# Patient Record
Sex: Female | Born: 1945 | Race: Black or African American | Hispanic: No | Smoking: Never smoker
Health system: Southern US, Community
[De-identification: ages and names within clinical notes are randomized; demographics above are authoritative.]

## PROBLEM LIST (undated history)

## (undated) DIAGNOSIS — B351 Tinea unguium: Secondary | ICD-10-CM

## (undated) DIAGNOSIS — I10 Essential (primary) hypertension: Secondary | ICD-10-CM

## (undated) DIAGNOSIS — E669 Obesity, unspecified: Secondary | ICD-10-CM

## (undated) DIAGNOSIS — E119 Type 2 diabetes mellitus without complications: Secondary | ICD-10-CM

## (undated) DIAGNOSIS — C50919 Malignant neoplasm of unspecified site of unspecified female breast: Secondary | ICD-10-CM

## (undated) DIAGNOSIS — E7800 Pure hypercholesterolemia, unspecified: Secondary | ICD-10-CM

## (undated) DIAGNOSIS — M2550 Pain in unspecified joint: Secondary | ICD-10-CM

## (undated) DIAGNOSIS — M5430 Sciatica, unspecified side: Secondary | ICD-10-CM

## (undated) HISTORY — PX: KIDNEY CYST REMOVAL: SHX684

## (undated) HISTORY — PX: BREAST LUMPECTOMY: SHX2

## (undated) HISTORY — DX: Sciatica, unspecified side: M54.30

## (undated) HISTORY — PX: LYMPH NODE DISSECTION: SHX5087

## (undated) HISTORY — DX: Pain in unspecified joint: M25.50

## (undated) HISTORY — DX: Obesity, unspecified: E66.9

## (undated) HISTORY — DX: Essential (primary) hypertension: I10

## (undated) HISTORY — DX: Tinea unguium: B35.1

## (undated) HISTORY — DX: Type 2 diabetes mellitus without complications: E11.9

## (undated) HISTORY — DX: Pure hypercholesterolemia, unspecified: E78.00

## (undated) HISTORY — PX: HERNIA REPAIR: SHX51

---

## 2011-12-02 ENCOUNTER — Encounter: Payer: Self-pay | Admitting: Cardiology

## 2011-12-02 ENCOUNTER — Encounter: Payer: Self-pay | Admitting: *Deleted

## 2011-12-02 ENCOUNTER — Telehealth: Payer: Self-pay | Admitting: Cardiology

## 2011-12-02 NOTE — Telephone Encounter (Signed)
Follow-up: ° ° ° °Patient called returning your call. Please call back. °

## 2011-12-02 NOTE — Telephone Encounter (Signed)
Spoke with pt, appt tomorrow moved from 8:45a to 11:30AM in [place]

## 2011-12-03 ENCOUNTER — Encounter: Payer: Self-pay | Admitting: Cardiology

## 2011-12-03 ENCOUNTER — Institutional Professional Consult (permissible substitution): Payer: Self-pay | Admitting: Cardiology

## 2011-12-03 ENCOUNTER — Ambulatory Visit (INDEPENDENT_AMBULATORY_CARE_PROVIDER_SITE_OTHER): Payer: Self-pay | Admitting: Cardiology

## 2011-12-03 VITALS — BP 130/80 | HR 97 | Ht 62.0 in | Wt 181.0 lb

## 2011-12-03 DIAGNOSIS — E785 Hyperlipidemia, unspecified: Secondary | ICD-10-CM | POA: Insufficient documentation

## 2011-12-03 DIAGNOSIS — I10 Essential (primary) hypertension: Secondary | ICD-10-CM | POA: Insufficient documentation

## 2011-12-03 DIAGNOSIS — E119 Type 2 diabetes mellitus without complications: Secondary | ICD-10-CM

## 2011-12-03 DIAGNOSIS — R002 Palpitations: Secondary | ICD-10-CM | POA: Insufficient documentation

## 2011-12-03 MED ORDER — METOPROLOL SUCCINATE ER 25 MG PO TB24: 25.0000 mg | ORAL_TABLET | Freq: Every day | ORAL | Status: DC

## 2011-12-03 NOTE — Assessment & Plan Note (Addendum)
Patient has mild palpitations and a sensation that her heart rate is elevated. She is mildly tachycardic at times. I will discontinue her Norvasc and instead treat her blood pressure with Toprol 25 mg daily. Hopefully this will improve her symptoms. Note she had recent laboratories that showed a potassium of 4.1 and normal renal function. Hemoglobin in may of 2013 12.9. TSH 1.773. Check echocardiogram for LV function.

## 2011-12-03 NOTE — Progress Notes (Signed)
HPI: 65 year old female with no prior cardiac history for evaluation of palpitations and tachycardia. Patient checks her pulse routinely and it runs between 94 and 120. She occasionally feels an elevation in her heart rate but no sustained palpitations. She has mild dyspnea on exertion but no orthopnea, PND, pedal edema, syncope or exertional chest pain. Because of her palpitations we were asked to evaluate.  Current Outpatient Prescriptions  Medication Sig Dispense Refill  . amLODipine (NORVASC) 5 MG tablet Take 5 mg by mouth daily.      Marland Kitchen aspirin 81 MG tablet Take 81 mg by mouth daily.      . calcium carbonate (OS-CAL) 600 MG TABS Take 600 mg by mouth daily.      . cholecalciferol (VITAMIN D) 1000 UNITS tablet Take 1,000 Units by mouth daily.      Marland Kitchen co-enzyme Q-10 30 MG capsule Take 30 mg by mouth daily.      Marland Kitchen ezetimibe (ZETIA) 10 MG tablet Take 10 mg by mouth daily.      Marland Kitchen gabapentin (NEURONTIN) 300 MG capsule Take 300 mg by mouth 2 (two) times daily.      Marland Kitchen HYDROcodone-acetaminophen (NORCO/VICODIN) 5-325 MG per tablet Take 1 tablet by mouth every 6 (six) hours as needed.      Marland Kitchen lisinopril (PRINIVIL,ZESTRIL) 2.5 MG tablet Take 2.5 mg by mouth daily.      . methocarbamol (ROBAXIN) 750 MG tablet Take 750 mg by mouth 3 (three) times daily.      . Omega-3 Fatty Acids (FISH OIL PO) Take 1 tablet by mouth daily.        No Known Allergies  Past Medical History  Diagnosis Date  . Diabetes mellitus   . Hypercholesteremia   . HTN (hypertension)   . Sciatica   . Joint pain   . Obesity   . Onychomycosis     Past Surgical History  Procedure Date  . Hernia repair     History   Social History  . Marital Status: Legally Separated    Spouse Name: N/A    Number of Children: 2  . Years of Education: N/A   Occupational History  .      Retired   Social History Main Topics  . Smoking status: Never Smoker   . Smokeless tobacco: Not on file  . Alcohol Use: No  . Drug Use: No  .  Sexually Active: Not on file   Other Topics Concern  . Not on file   Social History Narrative  . No narrative on file    Family History  Problem Relation Age of Onset  . Diabetes    . Heart disease Sister     Tachycardia  . Hypertension    . Coronary artery disease Mother     MI at age 63    ROS: problems with back pain but no fevers or chills, productive cough, hemoptysis, dysphasia, odynophagia, melena, hematochezia, dysuria, hematuria, rash, seizure activity, orthopnea, PND, pedal edema, claudication. Remaining systems are negative.  Physical Exam:   Blood pressure 130/80, pulse 97, height 5\' 2"  (1.575 m), weight 181 lb (82.101 kg).  General:  Well developed/well nourished in NAD Skin warm/dry Patient not depressed No peripheral clubbing Back-normal HEENT-normal/normal eyelids Neck supple/normal carotid upstroke bilaterally; no bruits; no JVD; no thyromegaly chest - CTA/ normal expansion CV - RRR/normal S1 and S2; no murmurs, rubs or gallops;  PMI nondisplaced Abdomen -NT/ND, no HSM, no mass, + bowel sounds, no bruit 2+ femoral pulses, no  bruits Ext-no edema, chords, 2+ DP Neuro-grossly nonfocal  ECG sinus rhythm at a rate of 97. Nonspecific ST changes.

## 2011-12-03 NOTE — Patient Instructions (Addendum)
Your physician recommends that you schedule a follow-up appointment in: 8 WEEKS WITH DR CRENSHAW IN [place]  Your physician has requested that you have an echocardiogram. Echocardiography is a painless test that uses sound waves to create images of your heart. It provides your doctor with information about the size and shape of your heart and how well your heart's chambers and valves are working. This procedure takes approximately one hour. There are no restrictions for this procedure.   STOP AMLODIPINE  START METOPROLOL ER 25 MG ONCE DAILY WITH FOOD

## 2011-12-03 NOTE — Assessment & Plan Note (Signed)
Blood pressure controlled. Discontinue Norvasc and begin Toprol 25 mg daily for blood pressure and palpitations.

## 2011-12-03 NOTE — Assessment & Plan Note (Signed)
Management per primary care. 

## 2011-12-05 ENCOUNTER — Ambulatory Visit (HOSPITAL_COMMUNITY): Payer: Self-pay | Attending: Cardiology

## 2011-12-05 DIAGNOSIS — I379 Nonrheumatic pulmonary valve disorder, unspecified: Secondary | ICD-10-CM | POA: Insufficient documentation

## 2011-12-05 DIAGNOSIS — I369 Nonrheumatic tricuspid valve disorder, unspecified: Secondary | ICD-10-CM | POA: Insufficient documentation

## 2011-12-05 DIAGNOSIS — I059 Rheumatic mitral valve disease, unspecified: Secondary | ICD-10-CM | POA: Insufficient documentation

## 2011-12-05 DIAGNOSIS — I10 Essential (primary) hypertension: Secondary | ICD-10-CM | POA: Insufficient documentation

## 2011-12-05 DIAGNOSIS — R002 Palpitations: Secondary | ICD-10-CM | POA: Insufficient documentation

## 2011-12-05 DIAGNOSIS — E119 Type 2 diabetes mellitus without complications: Secondary | ICD-10-CM | POA: Insufficient documentation

## 2011-12-05 NOTE — Progress Notes (Signed)
Echocardiogram performed.  

## 2012-01-21 ENCOUNTER — Telehealth: Payer: Self-pay | Admitting: Cardiology

## 2012-01-21 ENCOUNTER — Encounter: Payer: Self-pay | Admitting: Cardiology

## 2012-01-21 ENCOUNTER — Ambulatory Visit (INDEPENDENT_AMBULATORY_CARE_PROVIDER_SITE_OTHER): Payer: Self-pay | Admitting: Cardiology

## 2012-01-21 VITALS — BP 120/78 | HR 94 | Ht 63.0 in | Wt 178.0 lb

## 2012-01-21 DIAGNOSIS — R002 Palpitations: Secondary | ICD-10-CM

## 2012-01-21 MED ORDER — METOPROLOL SUCCINATE ER 50 MG PO TB24: 50.0000 mg | ORAL_TABLET | Freq: Every day | ORAL | Status: AC

## 2012-01-21 NOTE — Assessment & Plan Note (Signed)
Blood pressure is controlled. Given that we are increasing Toprol I will discontinue Norvasc. Followup with her primary care for blood pressure.

## 2012-01-21 NOTE — Progress Notes (Signed)
   HPI: 66 year old female with no prior cardiac history I intially saw in Sept 2013 for evaluation of palpitations and tachycardia. It was noted at that time that recent laboratories included a potassium of 4.1, and that hemoglobin in May of 2013 was 12.9 and TSH 1.773. Echocardiogram in September of 2013 showed normal LV function and grade 1 diastolic dysfunction. When I saw her previously we discontinued her Norvasc and add Toprol for her blood pressure and palpitations. Since then, her heart rate is mildly elevated. There is no dyspnea or chest pain and no syncope.   Current Outpatient Prescriptions  Medication Sig Dispense Refill  . amLODipine (NORVASC) 5 MG tablet Take 5 mg by mouth daily.      Marland Kitchen aspirin 81 MG tablet Take 81 mg by mouth daily.      . calcium carbonate (OS-CAL) 600 MG TABS Take 600 mg by mouth daily.      . cholecalciferol (VITAMIN D) 1000 UNITS tablet Take 1,000 Units by mouth daily.      Marland Kitchen co-enzyme Q-10 30 MG capsule Take 30 mg by mouth daily.      . colesevelam (WELCHOL) 625 MG tablet 3 tabs po bid      . ezetimibe (ZETIA) 10 MG tablet Take 10 mg by mouth daily.      Marland Kitchen gabapentin (NEURONTIN) 300 MG capsule Take 300 mg by mouth 2 (two) times daily.      Marland Kitchen HYDROcodone-acetaminophen (NORCO/VICODIN) 5-325 MG per tablet Take 1 tablet by mouth every 6 (six) hours as needed.      Marland Kitchen lisinopril (PRINIVIL,ZESTRIL) 2.5 MG tablet Take 2.5 mg by mouth daily.      . methocarbamol (ROBAXIN) 750 MG tablet Take 750 mg by mouth 3 (three) times daily.      . metoprolol succinate (TOPROL XL) 25 MG 24 hr tablet Take 1 tablet (25 mg total) by mouth daily.  30 tablet  11  . Omega-3 Fatty Acids (FISH OIL PO) Take 1 tablet by mouth daily.         Past Medical History  Diagnosis Date  . Diabetes mellitus   . Hypercholesteremia   . HTN (hypertension)   . Sciatica   . Joint pain   . Obesity   . Onychomycosis     Past Surgical History  Procedure Date  . Hernia repair     History    Social History  . Marital Status: Legally Separated    Spouse Name: N/A    Number of Children: 2  . Years of Education: N/A   Occupational History  .      Retired   Social History Main Topics  . Smoking status: Never Smoker   . Smokeless tobacco: Not on file  . Alcohol Use: No  . Drug Use: No  . Sexually Active: Not on file   Other Topics Concern  . Not on file   Social History Narrative  . No narrative on file    ROS: no fevers or chills, productive cough, hemoptysis, dysphasia, odynophagia, melena, hematochezia, dysuria, hematuria, rash, seizure activity, orthopnea, PND, pedal edema, claudication. Remaining systems are negative.  Physical Exam: Well-developed well-nourished in no acute distress.  Skin is warm and dry.  HEENT is normal.  Neck is supple.  Chest is clear to auscultation with normal expansion.  Cardiovascular exam is regular rate and rhythm.  Abdominal exam nontender or distended. No masses palpated. Extremities show no edema. neuro grossly intact

## 2012-01-21 NOTE — Assessment & Plan Note (Signed)
Improved but heart rate mildly increased with associated palpitations by her report. Plan to increase Toprol to 50 mg daily. Discontinue Norvasc.

## 2012-01-21 NOTE — Patient Instructions (Addendum)
Your physician recommends that you schedule a follow-up appointment in: AS NEEDED  STOP AMLODIPINE  INCREASE METOPROLOL TO 50 MG ONCE DAILY

## 2012-01-21 NOTE — Telephone Encounter (Signed)
Caller: [name]/Patient; Patient Name: Natalie Patrick; PCP: Peggyann Juba, Melissa (Adults only); Best Callback Phone Number: [phone].  Call regarding:  Pt seen this am and forgot to ask what is the normal heart rate for an adult.  Advised 60-100.  No symptoms. No triage.

## 2015-10-10 ENCOUNTER — Encounter (HOSPITAL_BASED_OUTPATIENT_CLINIC_OR_DEPARTMENT_OTHER): Payer: Self-pay

## 2015-10-10 ENCOUNTER — Other Ambulatory Visit: Payer: Self-pay | Admitting: Emergency Medicine

## 2015-10-10 ENCOUNTER — Emergency Department (HOSPITAL_BASED_OUTPATIENT_CLINIC_OR_DEPARTMENT_OTHER): Payer: Medicare HMO

## 2015-10-10 ENCOUNTER — Emergency Department (HOSPITAL_BASED_OUTPATIENT_CLINIC_OR_DEPARTMENT_OTHER)
Admission: EM | Admit: 2015-10-10 | Discharge: 2015-10-10 | Disposition: A | Discharge status: Home / Self Care | Payer: Medicare HMO | Attending: Emergency Medicine | Admitting: Emergency Medicine

## 2015-10-10 DIAGNOSIS — E119 Type 2 diabetes mellitus without complications: Secondary | ICD-10-CM | POA: Insufficient documentation

## 2015-10-10 DIAGNOSIS — Z7982 Long term (current) use of aspirin: Secondary | ICD-10-CM | POA: Diagnosis not present

## 2015-10-10 DIAGNOSIS — I10 Essential (primary) hypertension: Secondary | ICD-10-CM | POA: Diagnosis not present

## 2015-10-10 DIAGNOSIS — Z79899 Other long term (current) drug therapy: Secondary | ICD-10-CM | POA: Insufficient documentation

## 2015-10-10 DIAGNOSIS — K529 Noninfective gastroenteritis and colitis, unspecified: Secondary | ICD-10-CM

## 2015-10-10 DIAGNOSIS — A084 Viral intestinal infection, unspecified: Secondary | ICD-10-CM | POA: Diagnosis not present

## 2015-10-10 DIAGNOSIS — Z853 Personal history of malignant neoplasm of breast: Secondary | ICD-10-CM | POA: Diagnosis not present

## 2015-10-10 DIAGNOSIS — R000 Tachycardia, unspecified: Secondary | ICD-10-CM | POA: Diagnosis not present

## 2015-10-10 DIAGNOSIS — R112 Nausea with vomiting, unspecified: Secondary | ICD-10-CM | POA: Diagnosis present

## 2015-10-10 DIAGNOSIS — R509 Fever, unspecified: Secondary | ICD-10-CM

## 2015-10-10 HISTORY — DX: Malignant neoplasm of unspecified site of unspecified female breast: C50.919

## 2015-10-10 LAB — CBC WITH DIFFERENTIAL/PLATELET
BASOS PCT: 0 %
Band Neutrophils: 11 %
Basophils Absolute: 0 10*3/uL (ref 0.0–0.1)
Eosinophils Absolute: 0 10*3/uL (ref 0.0–0.7)
Eosinophils Relative: 0 %
HEMATOCRIT: 35.8 % — AB (ref 36.0–46.0)
Hemoglobin: 12.1 g/dL (ref 12.0–15.0)
Lymphocytes Relative: 32 %
Lymphs Abs: 0.9 10*3/uL (ref 0.7–4.0)
MCH: 25.2 pg — ABNORMAL LOW (ref 26.0–34.0)
MCHC: 33.8 g/dL (ref 30.0–36.0)
MCV: 74.4 fL — AB (ref 78.0–100.0)
MONO ABS: 0.6 10*3/uL (ref 0.1–1.0)
MYELOCYTES: 2 %
Metamyelocytes Relative: 1 %
Monocytes Relative: 22 %
NEUTROS ABS: 1.3 10*3/uL — AB (ref 1.7–7.7)
NEUTROS PCT: 32 %
Platelets: 135 10*3/uL — ABNORMAL LOW (ref 150–400)
RBC: 4.81 MIL/uL (ref 3.87–5.11)
RDW: 14 % (ref 11.5–15.5)
WBC: 2.8 10*3/uL — ABNORMAL LOW (ref 4.0–10.5)

## 2015-10-10 LAB — I-STAT CG4 LACTIC ACID, ED: LACTIC ACID, VENOUS: 1.31 mmol/L (ref 0.5–1.9)

## 2015-10-10 LAB — BASIC METABOLIC PANEL
ANION GAP: 8 (ref 5–15)
BUN: 20 mg/dL (ref 6–20)
CO2: 30 mmol/L (ref 22–32)
Calcium: 8.6 mg/dL — ABNORMAL LOW (ref 8.9–10.3)
Chloride: 96 mmol/L — ABNORMAL LOW (ref 101–111)
Creatinine, Ser: 1.34 mg/dL — ABNORMAL HIGH (ref 0.44–1.00)
GFR calc Af Amer: 46 mL/min — ABNORMAL LOW (ref >60)
GFR calc non Af Amer: 40 mL/min — ABNORMAL LOW (ref >60)
GLUCOSE: 126 mg/dL — AB (ref 65–99)
POTASSIUM: 3.3 mmol/L — AB (ref 3.5–5.1)
Sodium: 134 mmol/L — ABNORMAL LOW (ref 135–145)

## 2015-10-10 LAB — URINE MICROSCOPIC-ADD ON

## 2015-10-10 LAB — URINALYSIS, ROUTINE W REFLEX MICROSCOPIC
BILIRUBIN URINE: NEGATIVE
GLUCOSE, UA: NEGATIVE mg/dL
Hgb urine dipstick: NEGATIVE
Ketones, ur: 15 mg/dL — AB
Leukocytes, UA: NEGATIVE
Nitrite: NEGATIVE
PH: 5.5 (ref 5.0–8.0)
Protein, ur: 30 mg/dL — AB
Specific Gravity, Urine: 1.028 (ref 1.005–1.030)

## 2015-10-10 MED ORDER — DIPHENOXYLATE-ATROPINE 2.5-0.025 MG PO TABS: 1.0000 | ORAL_TABLET | Freq: Four times a day (QID) | ORAL | Status: AC | PRN

## 2015-10-10 MED ORDER — ONDANSETRON 4 MG PO TBDP: 4.0000 mg | ORAL_TABLET | Freq: Three times a day (TID) | ORAL | Status: AC | PRN

## 2015-10-10 MED ORDER — SODIUM CHLORIDE 0.9 % IV BOLUS (SEPSIS)
1000.0000 mL | Freq: Once | INTRAVENOUS | Status: AC
  Administered 2015-10-10: 1000 mL via INTRAVENOUS

## 2015-10-10 MED ORDER — DEXTROSE 5 % IV SOLN
1.0000 g | Freq: Once | INTRAVENOUS | Status: AC
  Administered 2015-10-10: 1 g via INTRAVENOUS
  Filled 2015-10-10: qty 10

## 2015-10-10 MED ORDER — ONDANSETRON 4 MG PO TBDP
4.0000 mg | ORAL_TABLET | Freq: Once | ORAL | Status: AC
  Administered 2015-10-10: 4 mg via ORAL
  Filled 2015-10-10: qty 1

## 2015-10-10 MED ORDER — ONDANSETRON HCL 4 MG/2ML IJ SOLN
4.0000 mg | Freq: Once | INTRAMUSCULAR | Status: AC
  Administered 2015-10-10: 4 mg via INTRAVENOUS
  Filled 2015-10-10: qty 2

## 2015-10-10 MED ORDER — METOPROLOL TARTRATE 5 MG/5ML IV SOLN
5.0000 mg | Freq: Once | INTRAVENOUS | Status: AC
  Administered 2015-10-10: 5 mg via INTRAVENOUS
  Filled 2015-10-10: qty 5

## 2015-10-10 MED ORDER — DIPHENOXYLATE-ATROPINE 2.5-0.025 MG PO TABS
2.0000 | ORAL_TABLET | Freq: Once | ORAL | Status: AC
  Administered 2015-10-10: 2 via ORAL
  Filled 2015-10-10: qty 2

## 2015-10-10 MED ORDER — ACETAMINOPHEN 325 MG PO TABS
650.0000 mg | ORAL_TABLET | Freq: Once | ORAL | Status: AC
  Administered 2015-10-10: 650 mg via ORAL
  Filled 2015-10-10: qty 2

## 2015-10-10 MED ORDER — BENZONATATE 100 MG PO CAPS: 100.0000 mg | ORAL_CAPSULE | Freq: Three times a day (TID) | ORAL | Status: AC

## 2015-10-10 MED ORDER — ONDANSETRON HCL 4 MG/2ML IJ SOLN: 4.0000 mg | Freq: Once | INTRAMUSCULAR | Status: DC

## 2015-10-10 NOTE — ED Notes (Signed)
Patient transported to X-ray 

## 2015-10-10 NOTE — ED Provider Notes (Signed)
CSN: [id number]     Arrival date & time 10/10/15  1512 History   First MD Initiated Contact with Patient 10/10/15 1531     Chief Complaint  Patient presents with  . Cough      HPI  Patient presents for evaluation of a 4 day illness.  Started on Saturday with some diarrhea. Has had some mild nausea. Vomiting yesterday and today. Fever this morning up to 102 at home. Has a cough but is not short of breath. Sore nose, sore throat, no shortness of breath, but frequent cough.  Past medical history significant for breast cancer diagnosed 08/2015. Underwent lumpectomy and left axillary lymph node dissection. Started chemotherapy with her first dose of IV chemotherapy 2 weeks ago. Was given a Neulasta patch that she took off at 48 hours.  Was told to expect some diarrhea and some nausea.  Additional significant past medical history includes diet-controlled nondistended DM 2, hypertension on multiple medications, chronic sinus tachycardia with resting heart rate 120 despite metoprolol. States she is prescribed 50  mg twice a day, but only takes once per day because her primary care doctor was "afraid my blood pressure would get too low"  Denies any urinary symptoms. No neck stiffness. No specific or localizing joint pain. Diffuse myalgias.  Does not have an Infuse-a-Port.  Past Medical History  Diagnosis Date  . Diabetes mellitus ([place])   . Hypercholesteremia   . HTN (hypertension)   . Sciatica   . Joint pain   . Obesity   . Onychomycosis   . Breast cancer [facility])    Past Surgical History  Procedure Laterality Date  . Hernia repair    . Breast lumpectomy    . Lymph node dissection    . Kidney cyst removal     Family History  Problem Relation Age of Onset  . Diabetes    . Heart disease Sister     Tachycardia  . Hypertension    . Coronary artery disease Mother     MI at age 55   Social History  Substance Use Topics  . Smoking status: Never Smoker   . Smokeless tobacco: None  .  Alcohol Use: No   OB History    No data available     Review of Systems  Constitutional: Positive for fever, chills and fatigue. Negative for diaphoresis and appetite change.  HENT: Positive for congestion. Negative for mouth sores, sore throat and trouble swallowing.   Eyes: Negative for visual disturbance.  Respiratory: Positive for cough. Negative for chest tightness, shortness of breath and wheezing.   Cardiovascular: Negative for chest pain.  Gastrointestinal: Positive for nausea, vomiting and diarrhea. Negative for abdominal pain and abdominal distention.  Endocrine: Negative for polydipsia, polyphagia and polyuria.  Genitourinary: Negative for dysuria, urgency, frequency, hematuria, flank pain and difficulty urinating.  Musculoskeletal: Positive for myalgias. Negative for gait problem.  Skin: Negative for color change, pallor and rash.  Neurological: Negative for dizziness, syncope, light-headedness and headaches.  Hematological: Does not bruise/bleed easily.       No rash or petechiae  Psychiatric/Behavioral: Negative for behavioral problems and confusion.      Allergies  Bactrim; Crestor; Lisinopril; Sulfamethoxazole; Welchol; and Zetia  Home Medications   Prior to Admission medications   Medication Sig Start Date End Date Taking? Authorizing Provider  cholecalciferol (VITAMIN D) 1000 UNITS tablet Take 1,000 Units by mouth daily.   Yes Historical Provider, MD  co-enzyme Q-10 30 MG capsule Take 30 mg by mouth  daily.   Yes Historical Provider, MD  Omega-3 Fatty Acids (FISH OIL PO) Take 1 tablet by mouth daily.   Yes Historical Provider, MD  pregabalin (LYRICA) 75 MG capsule Take 75 mg by mouth 2 (two) times daily.   Yes Historical Provider, MD  aspirin 81 MG tablet Take 81 mg by mouth daily.    Historical Provider, MD  calcium carbonate (OS-CAL) 600 MG TABS Take 600 mg by mouth daily.    Historical Provider, MD  colesevelam (WELCHOL) 625 MG tablet 3 tabs po bid     Historical Provider, MD  diphenoxylate-atropine (LOMOTIL) 2.5-0.025 MG tablet Take 1 tablet by mouth 4 (four) times daily as needed for diarrhea or loose stools. 10/10/15   Tanna Furry, MD  ezetimibe (ZETIA) 10 MG tablet Take 10 mg by mouth daily.    Historical Provider, MD  gabapentin (NEURONTIN) 300 MG capsule Take 300 mg by mouth 2 (two) times daily.    Historical Provider, MD  HYDROcodone-acetaminophen (NORCO/VICODIN) 5-325 MG per tablet Take 1 tablet by mouth every 6 (six) hours as needed.    Historical Provider, MD  lisinopril (PRINIVIL,ZESTRIL) 2.5 MG tablet Take 2.5 mg by mouth daily.    Historical Provider, MD  methocarbamol (ROBAXIN) 750 MG tablet Take 750 mg by mouth 3 (three) times daily.    Historical Provider, MD  metoprolol succinate (TOPROL XL) 50 MG 24 hr tablet Take 1 tablet (50 mg total) by mouth daily. 01/21/12 01/20/13  Lelon Perla, MD  ondansetron (ZOFRAN ODT) 4 MG disintegrating tablet Take 1 tablet (4 mg total) by mouth every 8 (eight) hours as needed for nausea. 10/10/15   Tanna Furry, MD   BP 137/84 mmHg  Pulse 110  Temp(Src) 100.4 F (38 C) (Oral)  Resp 20  Ht 5\' 2"  (1.575 m)  Wt 188 lb (85.276 kg)  BMI 34.38 kg/m2  SpO2 99% Physical Exam  Constitutional: She is oriented to person, place, and time. She appears well-developed and well-nourished.  Non-toxic appearance. She does not have a sickly appearance. She does not appear ill. No distress.  Appears to not feel well. But in no frank distress. Awake and alert.  HENT:  Head: Normocephalic.  Pharynx appears normal without hypertrophy or exudate. No thrush.  Eyes: Conjunctivae are normal. Pupils are equal, round, and reactive to light. No scleral icterus.  Conjunctiva not pale or injected.  Neck: Normal range of motion. Neck supple. No thyromegaly present.  Neck is supple. No adenopathy.  Cardiovascular: Regular rhythm.  Tachycardia present.  Exam reveals no gallop and no friction rub.   No murmur heard. Sinus  tachycardia at 120 on the monitor.  Pulmonary/Chest: Effort normal and breath sounds normal. No respiratory distress. She has no wheezes. She has no rales.  Clear bilateral breath sounds without increased work of breathing. No wheezing rales or rhonchi. No asymmetry.  Abdominal: Soft. Bowel sounds are normal. She exhibits no distension. There is no tenderness. There is no rebound.  Complains of nausea with palpation of the abdomen. But denies any focal tenderness.  Musculoskeletal: Normal range of motion.  Neurological: She is alert and oriented to person, place, and time.  Skin: Skin is warm and dry. No rash noted.  No rash. No petechiae. Capillary refill 2 seconds.  Psychiatric: She has a normal mood and affect. Her behavior is normal.    ED Course  Procedures (including critical care time) Labs Review Labs Reviewed  CBC WITH DIFFERENTIAL/PLATELET - Abnormal; Notable for the following:  WBC 2.8 (*)    HCT 35.8 (*)    MCV 74.4 (*)    MCH 25.2 (*)    Platelets 135 (*)    Neutro Abs 1.3 (*)    All other components within normal limits  BASIC METABOLIC PANEL - Abnormal; Notable for the following:    Sodium 134 (*)    Potassium 3.3 (*)    Chloride 96 (*)    Glucose, Bld 126 (*)    Creatinine, Ser 1.34 (*)    Calcium 8.6 (*)    GFR calc non Af Amer 40 (*)    GFR calc Af Amer 46 (*)    All other components within normal limits  URINALYSIS, ROUTINE W REFLEX MICROSCOPIC (NOT AT [facility]) - Abnormal; Notable for the following:    Color, Urine AMBER (*)    APPearance CLOUDY (*)    Ketones, ur 15 (*)    Protein, ur 30 (*)    All other components within normal limits  URINE MICROSCOPIC-ADD ON - Abnormal; Notable for the following:    Squamous Epithelial / LPF 0-5 (*)    Bacteria, UA RARE (*)    Casts HYALINE CASTS (*)    All other components within normal limits  CULTURE, BLOOD (ROUTINE X 2)  CULTURE, BLOOD (ROUTINE X 2)  URINE CULTURE  LACTIC ACID, PLASMA  LACTIC ACID, PLASMA   I-STAT CG4 LACTIC ACID, ED    Imaging Review Dg Chest 2 View  10/10/2015  CLINICAL DATA:  70 y/o F; cough, sneezing, fever, and diarrhea starting Saturday. Diagnosed right-sided breast cancer 1 month ago currently undergoing radiation and chemotherapy. EXAM: CHEST  2 VIEW COMPARISON:  None. FINDINGS: The heart size and mediastinal contours are within normal limits. Both lungs are clear. The visualized skeletal structures are unremarkable. IMPRESSION: No active cardiopulmonary disease. Electronically Signed   By: Kristine Garbe M.D.   On: 10/10/2015 16:41   I have personally reviewed and evaluated these images and lab results as part of my medical decision-making.   EKG Interpretation None      MDM   Final diagnoses:  Fever, unspecified fever cause  Acute gastroenteritis  Viral gastroenteritis    Normal chest x-ray. Urine does not appear infected. WBC 2.8. 32% neutrophils, 11% bands, ANC 1200.. Creatinine 1.34.  Clinically and protesting no sign of acute bacterial, or supper to the infection. 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  Clinically, she is feeling much better taking by mouth no additional vomiting or diarrhea.  Although she hasn't adequate ANC with it being 1200, and a total WBC of 2.8 after Neulasta last week, I obtained blood cultures and gave her 1 g of IV Rocephin. She states that she feels well enough to be at home. Her heart rate is improved at 107. She states this is "good for me". She states it is "normally not below 115. I asked her to call her oncologist for an update tomorrow. Astra return here with any new or worsening symptoms or depression recurrence of her symptoms from earlier today. I also told her that she may be contacted regarding any blood culture results tomorrow and she expresses understanding of this.    Tanna Furry, MD 10/10/15 [id number]

## 2015-10-10 NOTE — Discharge Instructions (Signed)
Your total blood count is low, but you have an adequate number of blood cells to fight infection. Please contact your oncologist tomorrow for an update. If any bacteria show up on your blood cultures he will be contacted. Push fluids and stay hydrated at home. Zofran for nausea, Lomotil for diarrhea. Return to ER with any new or worsening symptoms at home.   Blood Culture Test WHY AM I HAVING THIS TEST? A blood culture test is performed to see if you have an infection in your blood (septicemia). Septicemia could be caused by bacteria, fungi, or viruses. Normally, blood is free of bacteria, fungi, and viruses. This test may be ordered if you have symptoms of septicemia. These symptoms may include fever, chills, nausea, and fatigue. WHAT KIND OF SAMPLE IS TAKEN? At least two blood samples from two different veins are required for this test. The blood samples are usually collected by inserting a needle into a vein. This is done because:  There is a better chance of finding the infection with multiple samples.  Sometimes, despite disinfection of the skin where the blood is collected, you can grow a skin contaminant. This will result in a positive blood culture. This is called a false-positive. With multiple samples, there is a better chance of ruling out a false-positive. HOW DO I PREPARE FOR THE TEST? It is preferred to have the blood samples performed before starting antibiotic medicine. Tell your health care provider if you are currently taking an antibiotic. If blood cultures are performed while you are on an antibiotic, the blood samples should be performed shortly before you take a dose of antibiotic. HOW ARE YOUR TEST RESULTS REPORTED? Your test results will be reported as either positive or negative. It is your responsibility to obtain your test results. Ask the lab or department performing the test when and how you will get your results. A false-positive result can occur. A false-positive  result is incorrect because it indicates a condition or finding is present when it is not. A false-negative result can occur. A false-negative result is incorrect because it indicates a condition or finding is not present when it is. WHAT DO THE RESULTS MEAN? A positive blood test may mean that you have septicemia. Talk with your health care provider to discuss your results, treatment options, and if necessary, the need for more tests. Talk with your health care provider if you have any questions about your results.   This information is not intended to replace advice given to you by your health care provider. Make sure you discuss any questions you have with your health care provider.   Document Released: 04/02/2004 Document Revised: 03/31/2014 Document Reviewed: 08/15/2013 Elsevier Interactive Patient Education 2016 [employer].  Fever, Adult A fever is an increase in the body's temperature. It is usually defined as a temperature of 100F (38C) or higher. Brief mild or moderate fevers generally have no long-term effects, and they often do not require treatment. Moderate or high fevers may make you feel uncomfortable and can sometimes be a sign of a serious illness or disease. The sweating that may occur with repeated or prolonged fever may also cause dehydration. Fever is confirmed by taking a temperature with a thermometer. A measured temperature can vary with:  Age.  Time of day.  Location of the thermometer:  Mouth (oral).  Rectum (rectal).  Ear (tympanic).  Underarm (axillary).  Forehead (temporal). HOME CARE INSTRUCTIONS Pay attention to any changes in your symptoms. Take these actions to  help with your condition:  Take over-the counter and prescription medicines only as told by your health care provider. Follow the dosing instructions carefully.  If you were prescribed an antibiotic medicine, take it as told by your health care provider. Do not stop taking the antibiotic  even if you start to feel better.  Rest as needed.  Drink enough fluid to keep your urine clear or pale yellow. This helps to prevent dehydration.  Sponge yourself or bathe with room-temperature water to help reduce your body temperature as needed. Do not use ice water.  Do not overbundle yourself in blankets or heavy clothes. SEEK MEDICAL CARE IF:  You vomit.  You cannot eat or drink without vomiting.  You have diarrhea.  You have pain when you urinate.  Your symptoms do not improve with treatment.  You develop new symptoms.  You develop excessive weakness. SEEK IMMEDIATE MEDICAL CARE IF:  You have shortness of breath or have trouble breathing.  You are dizzy or you faint.  You are disoriented or confused.  You develop signs of dehydration, such as a dry mouth, decreased urination, or paleness.  You develop severe pain in your abdomen.  You have persistent vomiting or diarrhea.  You develop a skin rash.  Your symptoms suddenly get worse.   This information is not intended to replace advice given to you by your health care provider. Make sure you discuss any questions you have with your health care provider.   Document Released: 09/03/2000 Document Revised: 11/29/2014 Document Reviewed: 05/04/2014 Elsevier Interactive Patient Education 2016 [employer].  Viral Gastroenteritis Viral gastroenteritis is also known as stomach flu. This condition affects the stomach and intestinal tract. It can cause sudden diarrhea and vomiting. The illness typically lasts 3 to 8 days. Most people develop an immune response that eventually gets rid of the virus. While this natural response develops, the virus can make you quite ill. CAUSES  Many different viruses can cause gastroenteritis, such as rotavirus or noroviruses. You can catch one of these viruses by consuming contaminated food or water. You may also catch a virus by sharing utensils or other personal items with an infected  person or by touching a contaminated surface. SYMPTOMS  The most common symptoms are diarrhea and vomiting. These problems can cause a severe loss of body fluids (dehydration) and a body salt (electrolyte) imbalance. Other symptoms may include:  Fever.  Headache.  Fatigue.  Abdominal pain. DIAGNOSIS  Your caregiver can usually diagnose viral gastroenteritis based on your symptoms and a physical exam. A stool sample may also be taken to test for the presence of viruses or other infections. TREATMENT  This illness typically goes away on its own. Treatments are aimed at rehydration. The most serious cases of viral gastroenteritis involve vomiting so severely that you are not able to keep fluids down. In these cases, fluids must be given through an intravenous line (IV). HOME CARE INSTRUCTIONS   Drink enough fluids to keep your urine clear or pale yellow. Drink small amounts of fluids frequently and increase the amounts as tolerated.  Ask your caregiver for specific rehydration instructions.  Avoid:  Foods high in sugar.  Alcohol.  Carbonated drinks.  Tobacco.  Juice.  Caffeine drinks.  Extremely hot or cold fluids.  Fatty, greasy foods.  Too much intake of anything at one time.  Dairy products until 24 to 48 hours after diarrhea stops.  You may consume probiotics. Probiotics are active cultures of beneficial bacteria. They may lessen  the amount and number of diarrheal stools in adults. Probiotics can be found in yogurt with active cultures and in supplements.  Wash your hands well to avoid spreading the virus.  Only take over-the-counter or prescription medicines for pain, discomfort, or fever as directed by your caregiver. Do not give aspirin to children. Antidiarrheal medicines are not recommended.  Ask your caregiver if you should continue to take your regular prescribed and over-the-counter medicines.  Keep all follow-up appointments as directed by your  caregiver. SEEK IMMEDIATE MEDICAL CARE IF:   You are unable to keep fluids down.  You do not urinate at least once every 6 to 8 hours.  You develop shortness of breath.  You notice blood in your stool or vomit. This may look like coffee grounds.  You have abdominal pain that increases or is concentrated in one small area (localized).  You have persistent vomiting or diarrhea.  You have a fever.  The patient is a child younger than 3 months, and he or she has a fever.  The patient is a child older than 3 months, and he or she has a fever and persistent symptoms.  The patient is a child older than 3 months, and he or she has a fever and symptoms suddenly get worse.  The patient is a baby, and he or she has no tears when crying. MAKE SURE YOU:   Understand these instructions.  Will watch your condition.  Will get help right away if you are not doing well or get worse.   This information is not intended to replace advice given to you by your health care provider. Make sure you discuss any questions you have with your health care provider.   Document Released: 03/10/2005 Document Revised: 06/02/2011 Document Reviewed: 12/25/2010 Elsevier Interactive Patient Education [employer].

## 2015-10-10 NOTE — ED Notes (Signed)
C/o cough, sneezing, fever, diarrhea-started saturday-worse last night-presents to triage in w/c with family member-being treated for breast cancer with chemo-NAD

## 2015-10-11 LAB — BLOOD CULTURE ID PANEL (REFLEXED)
Acinetobacter baumannii: NOT DETECTED
CANDIDA ALBICANS: NOT DETECTED
CANDIDA GLABRATA: NOT DETECTED
CANDIDA KRUSEI: NOT DETECTED
CANDIDA PARAPSILOSIS: NOT DETECTED
CANDIDA TROPICALIS: NOT DETECTED
Carbapenem resistance: NOT DETECTED
ENTEROBACTER CLOACAE COMPLEX: NOT DETECTED
ESCHERICHIA COLI: NOT DETECTED
Enterobacteriaceae species: NOT DETECTED
Enterococcus species: NOT DETECTED
HAEMOPHILUS INFLUENZAE: NOT DETECTED
KLEBSIELLA OXYTOCA: NOT DETECTED
KLEBSIELLA PNEUMONIAE: NOT DETECTED
LISTERIA MONOCYTOGENES: NOT DETECTED
Methicillin resistance: NOT DETECTED
Neisseria meningitidis: NOT DETECTED
PROTEUS SPECIES: NOT DETECTED
Pseudomonas aeruginosa: NOT DETECTED
SERRATIA MARCESCENS: NOT DETECTED
STREPTOCOCCUS PNEUMONIAE: NOT DETECTED
STREPTOCOCCUS PYOGENES: NOT DETECTED
Staphylococcus aureus (BCID): NOT DETECTED
Staphylococcus species: NOT DETECTED
Streptococcus agalactiae: NOT DETECTED
Streptococcus species: NOT DETECTED
Vancomycin resistance: NOT DETECTED

## 2015-10-11 LAB — URINE CULTURE

## 2015-10-15 ENCOUNTER — Other Ambulatory Visit: Payer: Self-pay | Admitting: Emergency Medicine

## 2015-10-15 LAB — CULTURE, BLOOD (ROUTINE X 2): CULTURE: NO GROWTH

## 2015-10-16 NOTE — Progress Notes (Signed)
ED Antimicrobial Stewardship Positive Culture Follow Up   Natalie Patrick is an 69 y.o. female who presented to [facility] on 10/10/2015 with a chief complaint of  Chief Complaint  Patient presents with  . Cough    Recent Results (from the past 720 hour(s))  Culture, blood (Routine X 2) w Reflex to ID Panel     Status: Abnormal   Collection Time: 10/10/15  4:10 PM  Result Value Ref Range Status   Specimen Description BLOOD RIGHT ANTECUBITAL  Final   Special Requests BOTTLES DRAWN AEROBIC AND ANAEROBIC 10CC EACH  Final   Culture  Setup Time   Final    GRAM NEGATIVE RODS AEROBIC BOTTLE ONLY CRITICAL RESULT CALLED TO, READ BACK BY AND VERIFIED WITH: PHARM A.MASTERS 10/11/15 @1933  MLM    Culture (A)  Final    BACILLUS SPECIES Standardized susceptibility testing for this organism is not available. THIS ORGANISM MAY EXHIBIT VARIABILITY IN STAINING Performed at [facility]    Report Status 10/15/2015 FINAL  Final  Blood Culture ID Panel (Reflexed)     Status: None   Collection Time: 10/10/15  4:10 PM  Result Value Ref Range Status   Enterococcus species NOT DETECTED NOT DETECTED Final   Vancomycin resistance NOT DETECTED NOT DETECTED Final   Listeria monocytogenes NOT DETECTED NOT DETECTED Final   Staphylococcus species NOT DETECTED NOT DETECTED Final   Staphylococcus aureus NOT DETECTED NOT DETECTED Final   Methicillin resistance NOT DETECTED NOT DETECTED Final   Streptococcus species NOT DETECTED NOT DETECTED Final   Streptococcus agalactiae NOT DETECTED NOT DETECTED Final   Streptococcus pneumoniae NOT DETECTED NOT DETECTED Final   Streptococcus pyogenes NOT DETECTED NOT DETECTED Final   Acinetobacter baumannii NOT DETECTED NOT DETECTED Final   Enterobacteriaceae species NOT DETECTED NOT DETECTED Final   Enterobacter cloacae complex NOT DETECTED NOT DETECTED Final   Escherichia coli NOT DETECTED NOT DETECTED Final   Klebsiella oxytoca NOT DETECTED NOT DETECTED Final   Klebsiella pneumoniae NOT DETECTED NOT DETECTED Final   Proteus species NOT DETECTED NOT DETECTED Final   Serratia marcescens NOT DETECTED NOT DETECTED Final   Carbapenem resistance NOT DETECTED NOT DETECTED Final   Haemophilus influenzae NOT DETECTED NOT DETECTED Final   Neisseria meningitidis NOT DETECTED NOT DETECTED Final   Pseudomonas aeruginosa NOT DETECTED NOT DETECTED Final   Candida albicans NOT DETECTED NOT DETECTED Final   Candida glabrata NOT DETECTED NOT DETECTED Final   Candida krusei NOT DETECTED NOT DETECTED Final   Candida parapsilosis NOT DETECTED NOT DETECTED Final   Candida tropicalis NOT DETECTED NOT DETECTED Final    Comment: Performed at [facility]  Urine culture     Status: Abnormal   Collection Time: 10/10/15  4:40 PM  Result Value Ref Range Status   Specimen Description URINE, CLEAN CATCH  Final   Special Requests NONE  Final   Culture MULTIPLE SPECIES PRESENT, SUGGEST RECOLLECTION (A)  Final   Report Status 10/11/2015 FINAL  Final  Culture, blood (Routine X 2) w Reflex to ID Panel     Status: None   Collection Time: 10/10/15  5:00 PM  Result Value Ref Range Status   Specimen Description BLOOD LEFT ANTECUBITAL  Final   Special Requests BOTTLES DRAWN AEROBIC AND ANAEROBIC 5CC EACH  Final   Culture   Final    NO GROWTH 5 DAYS Performed at [facility]    Report Status 10/15/2015 FINAL  Final    Patient with 1/2 Blood  cultures positive for Bacillus species - this represents contamination.  No treatment recommended.   Norva Riffle 10/16/2015, 8:47 AM Infectious Diseases Pharmacist Phone# [phone]

## 2018-03-02 IMAGING — CR DG CHEST 2V
2 series · 2 of 2 positions shown · non-contrast
Comparison: None.

CLINICAL DATA: 70 y/o F; cough, sneezing, fever, and diarrhea
starting [REDACTED]. Diagnosed right-sided breast cancer 1 month ago
currently undergoing radiation and chemotherapy.

EXAM:
CHEST  2 VIEW

[w chest pa]
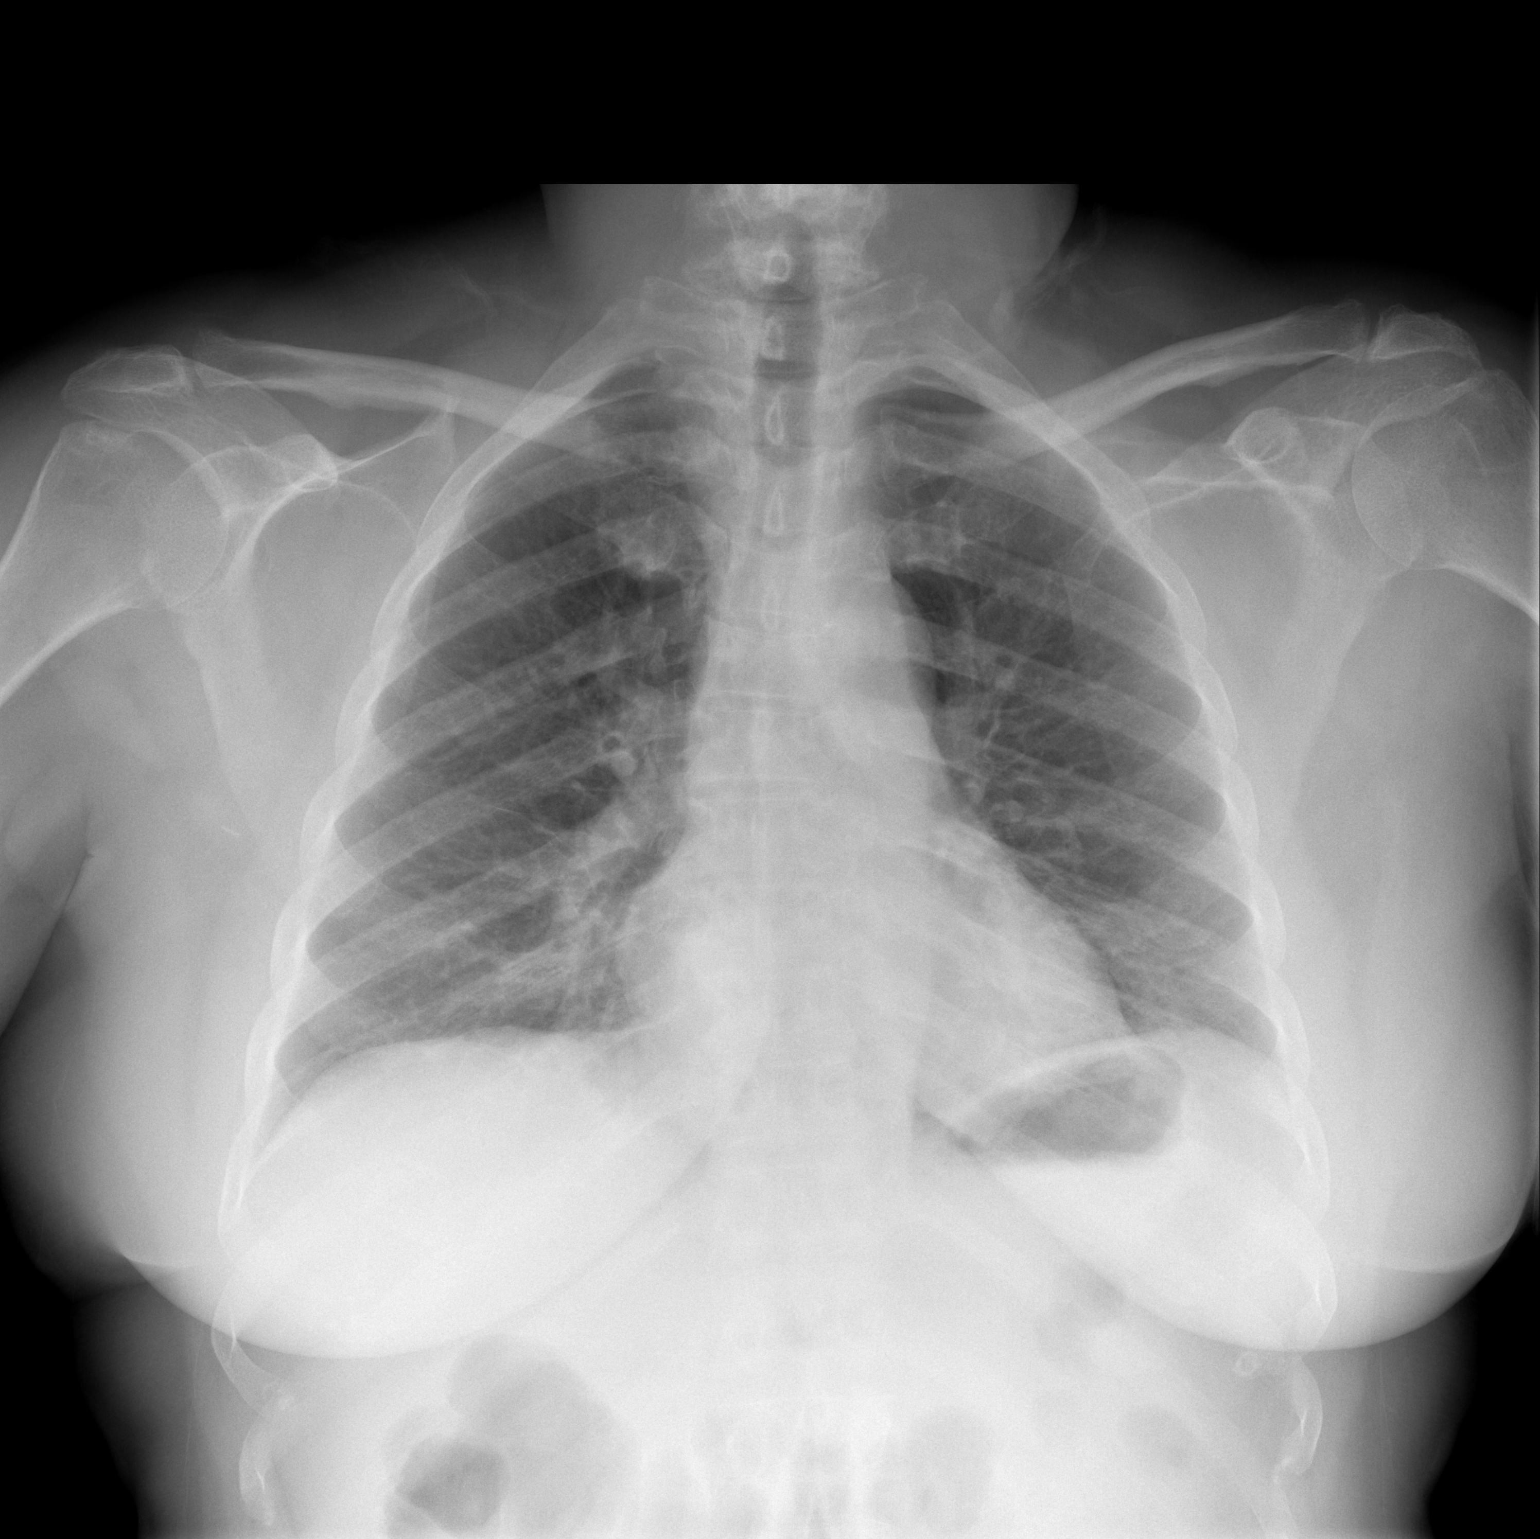

[w chest lat]
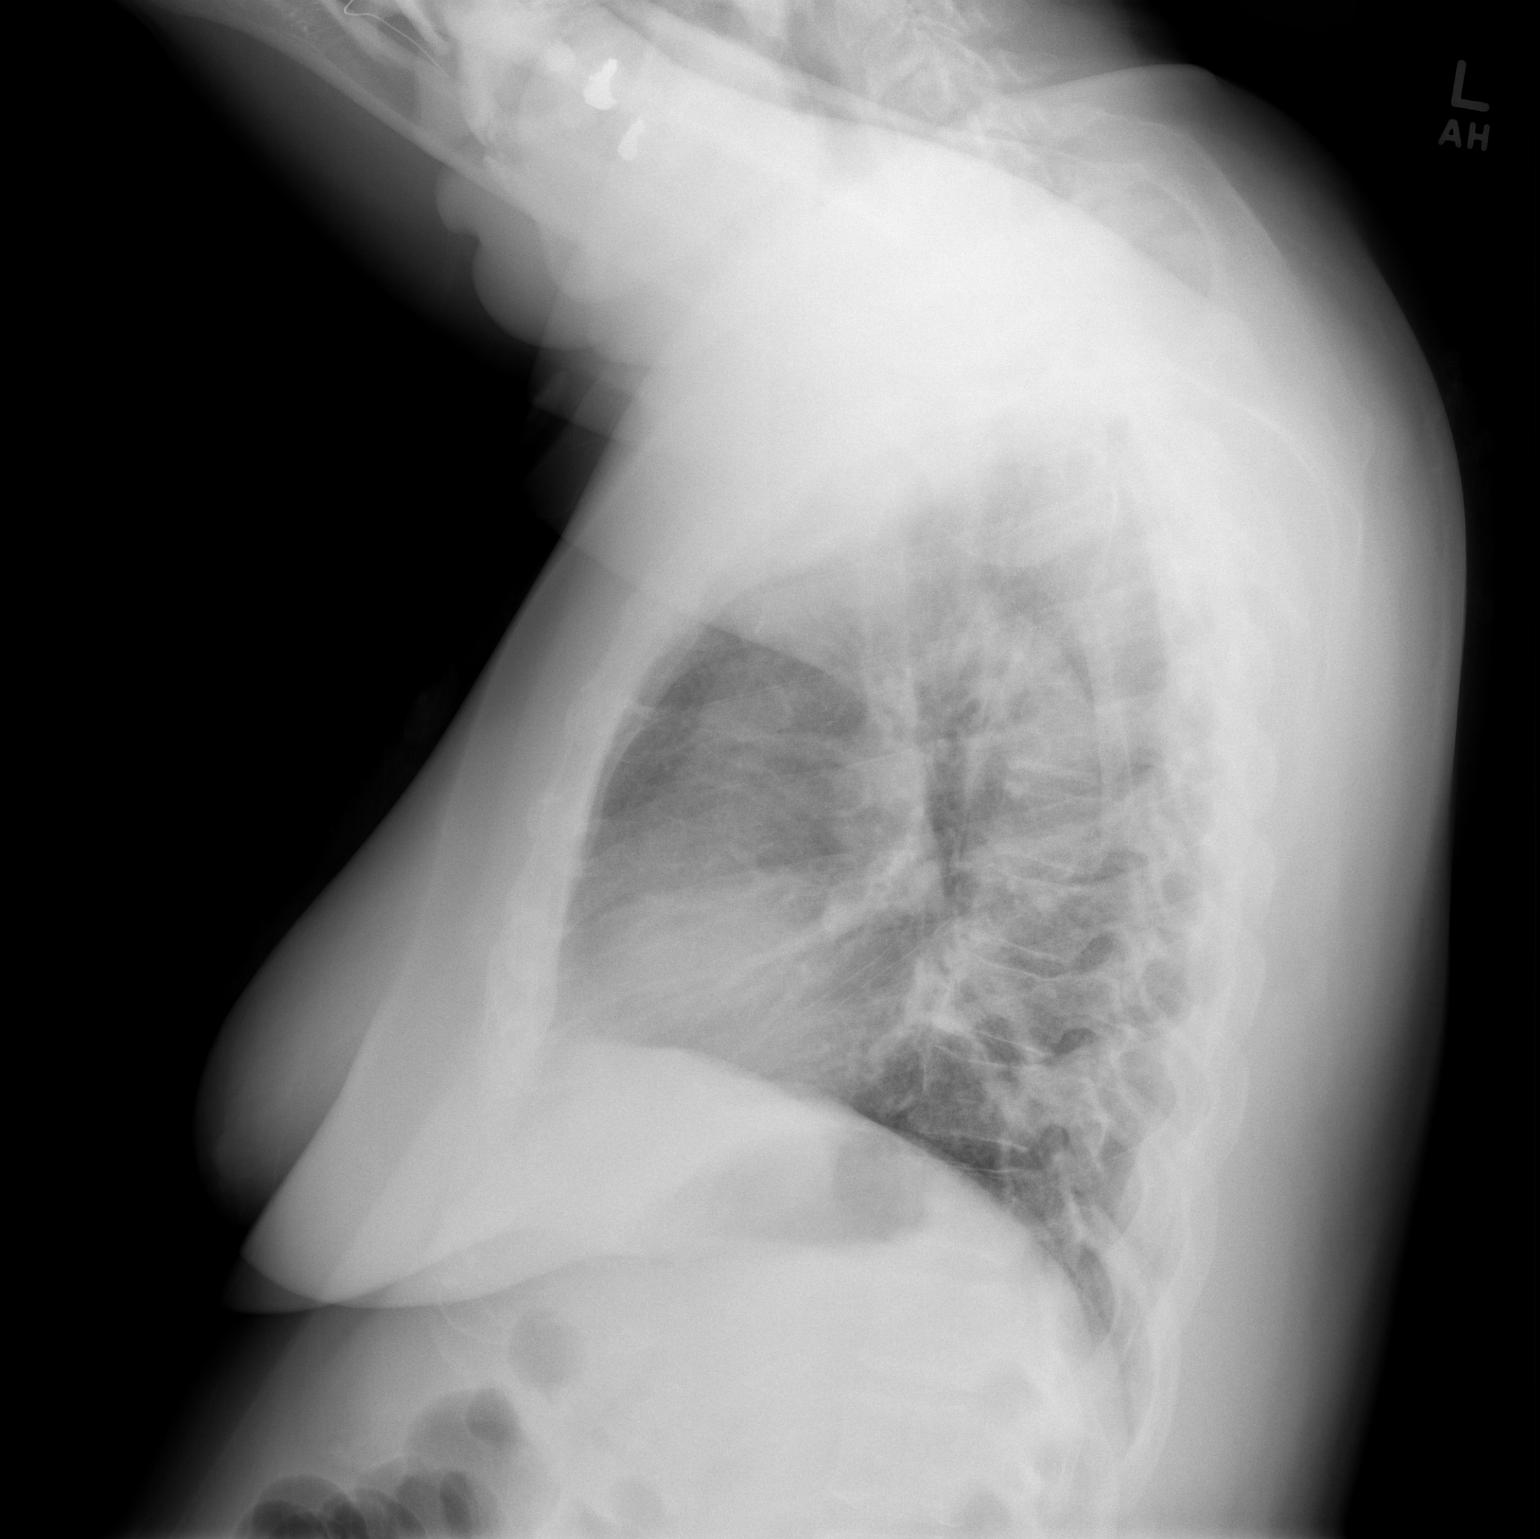

[2 of 2 positions shown; findings below may reference images not displayed]

FINDINGS: The heart size and mediastinal contours are within normal limits.
Both lungs are clear. The visualized skeletal structures are
unremarkable.
IMPRESSION: No active cardiopulmonary disease.

By: Akemi Welch M.D.
